# Patient Record
Sex: Female | Born: 1975 | Race: Black or African American | Hispanic: No | Marital: Single | Smoking: Never smoker
Health system: Southern US, Community
[De-identification: ages and names within clinical notes are randomized; demographics above are authoritative.]

## PROBLEM LIST (undated history)

## (undated) DIAGNOSIS — I10 Essential (primary) hypertension: Secondary | ICD-10-CM

---

## 2009-08-10 ENCOUNTER — Ambulatory Visit: Payer: Self-pay | Admitting: Obstetrics & Gynecology

## 2009-08-22 ENCOUNTER — Ambulatory Visit (HOSPITAL_COMMUNITY): Admission: RE | Admit: 2009-08-22 | Discharge: 2009-08-22 | Payer: Self-pay | Admitting: Obstetrics & Gynecology

## 2009-09-14 ENCOUNTER — Ambulatory Visit: Payer: Self-pay | Admitting: Family Medicine

## 2009-09-18 ENCOUNTER — Encounter: Payer: Self-pay | Admitting: Obstetrics & Gynecology

## 2009-09-18 ENCOUNTER — Ambulatory Visit: Payer: Self-pay | Admitting: Obstetrics & Gynecology

## 2009-09-18 LAB — CONVERTED CEMR LAB
AST: 39 units/L — ABNORMAL HIGH (ref 0–37)
Albumin: 3.8 g/dL (ref 3.5–5.2)
Antibody Screen: NEGATIVE
CO2: 23 meq/L (ref 19–32)
Chloride: 103 meq/L (ref 96–112)
Creatinine Clearance: 206 mL/min — ABNORMAL HIGH (ref 75–115)
Glucose, Bld: 128 mg/dL — ABNORMAL HIGH (ref 70–99)
HCT: 32.3 % — ABNORMAL LOW (ref 36.0–46.0)
Potassium: 4 meq/L (ref 3.5–5.3)
Protein, Ur: 683 mg/24hr — ABNORMAL HIGH (ref 50–100)
RDW: 13 % (ref 11.5–15.5)
Rh Type: POSITIVE
WBC: 5.6 10*3/uL (ref 4.0–10.5)

## 2009-09-19 ENCOUNTER — Ambulatory Visit (HOSPITAL_COMMUNITY): Admission: RE | Admit: 2009-09-19 | Discharge: 2009-09-19 | Payer: Self-pay | Admitting: Obstetrics & Gynecology

## 2009-09-28 ENCOUNTER — Ambulatory Visit: Payer: Self-pay | Admitting: Obstetrics & Gynecology

## 2009-10-17 ENCOUNTER — Ambulatory Visit (HOSPITAL_COMMUNITY): Admission: RE | Admit: 2009-10-17 | Discharge: 2009-10-17 | Payer: Self-pay | Admitting: Obstetrics & Gynecology

## 2009-11-02 ENCOUNTER — Ambulatory Visit: Payer: Self-pay | Admitting: Obstetrics & Gynecology

## 2009-11-16 ENCOUNTER — Ambulatory Visit: Payer: Self-pay | Admitting: Obstetrics & Gynecology

## 2009-11-28 ENCOUNTER — Ambulatory Visit (HOSPITAL_COMMUNITY): Admission: RE | Admit: 2009-11-28 | Discharge: 2009-11-28 | Payer: Self-pay | Admitting: Obstetrics & Gynecology

## 2009-11-30 ENCOUNTER — Ambulatory Visit: Payer: Self-pay | Admitting: Family Medicine

## 2009-11-30 LAB — CONVERTED CEMR LAB
HCT: 30.7 % — ABNORMAL LOW (ref 36.0–46.0)
Hemoglobin: 10.3 g/dL — ABNORMAL LOW (ref 12.0–15.0)
MCV: 84.6 fL (ref 78.0–100.0)
WBC: 6.8 10*3/uL (ref 4.0–10.5)

## 2009-12-14 ENCOUNTER — Ambulatory Visit: Payer: Self-pay | Admitting: Obstetrics & Gynecology

## 2009-12-21 ENCOUNTER — Ambulatory Visit: Payer: Self-pay | Admitting: Obstetrics & Gynecology

## 2009-12-21 LAB — CONVERTED CEMR LAB
HCT: 31.3 % — ABNORMAL LOW (ref 36.0–46.0)
MCV: 84.1 fL (ref 78.0–100.0)
RDW: 13.5 % (ref 11.5–15.5)

## 2009-12-26 ENCOUNTER — Ambulatory Visit (HOSPITAL_COMMUNITY): Admission: RE | Admit: 2009-12-26 | Discharge: 2009-12-26 | Payer: Self-pay | Admitting: Obstetrics & Gynecology

## 2010-01-01 ENCOUNTER — Ambulatory Visit: Payer: Self-pay | Admitting: Obstetrics & Gynecology

## 2010-01-04 ENCOUNTER — Ambulatory Visit: Payer: Self-pay | Admitting: Obstetrics and Gynecology

## 2010-01-11 ENCOUNTER — Ambulatory Visit: Payer: Self-pay | Admitting: Family Medicine

## 2010-01-16 ENCOUNTER — Ambulatory Visit (HOSPITAL_COMMUNITY): Admission: RE | Admit: 2010-01-16 | Discharge: 2010-01-16 | Payer: Self-pay | Admitting: Obstetrics & Gynecology

## 2010-01-19 ENCOUNTER — Ambulatory Visit: Payer: Self-pay | Admitting: Obstetrics & Gynecology

## 2010-01-23 ENCOUNTER — Ambulatory Visit: Payer: Self-pay | Admitting: Obstetrics & Gynecology

## 2010-01-25 ENCOUNTER — Ambulatory Visit: Payer: Self-pay | Admitting: Obstetrics & Gynecology

## 2010-01-30 ENCOUNTER — Inpatient Hospital Stay (HOSPITAL_COMMUNITY): Admission: AD | Admit: 2010-01-30 | Discharge: 2010-01-30 | Payer: Self-pay | Admitting: Obstetrics & Gynecology

## 2010-01-30 ENCOUNTER — Encounter: Payer: Self-pay | Admitting: *Deleted

## 2010-01-30 ENCOUNTER — Ambulatory Visit: Payer: Self-pay | Admitting: Advanced Practice Midwife

## 2010-02-01 ENCOUNTER — Ambulatory Visit: Payer: Self-pay | Admitting: Obstetrics & Gynecology

## 2010-02-06 ENCOUNTER — Ambulatory Visit (HOSPITAL_COMMUNITY): Admission: RE | Admit: 2010-02-06 | Discharge: 2010-02-06 | Payer: Self-pay | Admitting: Obstetrics & Gynecology

## 2010-02-06 ENCOUNTER — Ambulatory Visit: Payer: Self-pay | Admitting: Obstetrics & Gynecology

## 2010-02-12 ENCOUNTER — Ambulatory Visit: Payer: Self-pay | Admitting: Obstetrics & Gynecology

## 2010-02-13 ENCOUNTER — Ambulatory Visit (HOSPITAL_COMMUNITY): Admission: RE | Admit: 2010-02-13 | Discharge: 2010-02-13 | Payer: Self-pay | Admitting: Obstetrics & Gynecology

## 2010-02-15 ENCOUNTER — Ambulatory Visit: Payer: Self-pay | Admitting: Family Medicine

## 2010-02-19 ENCOUNTER — Ambulatory Visit: Payer: Self-pay | Admitting: Obstetrics & Gynecology

## 2010-02-19 ENCOUNTER — Ambulatory Visit (HOSPITAL_COMMUNITY): Admission: RE | Admit: 2010-02-19 | Discharge: 2010-02-19 | Payer: Self-pay | Admitting: Family Medicine

## 2010-02-22 ENCOUNTER — Ambulatory Visit: Payer: Self-pay | Admitting: Obstetrics & Gynecology

## 2010-02-26 ENCOUNTER — Encounter: Payer: Self-pay | Admitting: Obstetrics & Gynecology

## 2010-02-26 ENCOUNTER — Encounter (INDEPENDENT_AMBULATORY_CARE_PROVIDER_SITE_OTHER): Payer: Self-pay | Admitting: *Deleted

## 2010-02-26 ENCOUNTER — Ambulatory Visit: Payer: Self-pay | Admitting: Obstetrics & Gynecology

## 2010-02-26 LAB — CONVERTED CEMR LAB
ALT: 19 units/L (ref 0–35)
Albumin: 3.6 g/dL (ref 3.5–5.2)
BUN: 7 mg/dL (ref 6–23)
Calcium: 9.7 mg/dL (ref 8.4–10.5)
Creatinine 24 HR UR: 1520 mg/24hr (ref 700–1800)
Creatinine, Urine: 89.4 mg/dL
Glucose, Bld: 79 mg/dL (ref 70–99)
Hemoglobin: 11.9 g/dL — ABNORMAL LOW (ref 12.0–15.0)
MCV: 83.1 fL (ref 78.0–100.0)
Platelets: 164 10*3/uL (ref 150–400)
Protein, Ur: 714 mg/24hr — ABNORMAL HIGH (ref 50–100)
RBC: 4.21 M/uL (ref 3.87–5.11)
Sodium: 135 meq/L (ref 135–145)
Total Bilirubin: 0.5 mg/dL (ref 0.3–1.2)

## 2010-03-01 ENCOUNTER — Encounter: Payer: Self-pay | Admitting: Obstetrics & Gynecology

## 2010-03-01 ENCOUNTER — Ambulatory Visit: Payer: Self-pay | Admitting: Obstetrics & Gynecology

## 2010-03-01 LAB — CONVERTED CEMR LAB: Chlamydia, Swab/Urine, PCR: NEGATIVE

## 2010-03-03 ENCOUNTER — Encounter: Payer: Self-pay | Admitting: Obstetrics & Gynecology

## 2010-03-03 ENCOUNTER — Inpatient Hospital Stay (HOSPITAL_COMMUNITY): Admission: RE | Admit: 2010-03-03 | Discharge: 2010-03-08 | Payer: Self-pay | Admitting: Family Medicine

## 2010-03-03 ENCOUNTER — Ambulatory Visit: Payer: Self-pay | Admitting: Obstetrics & Gynecology

## 2010-09-23 ENCOUNTER — Encounter: Payer: Self-pay | Admitting: Obstetrics & Gynecology

## 2010-11-18 LAB — COMPREHENSIVE METABOLIC PANEL
ALT: 28 U/L (ref 0–35)
AST: 31 U/L (ref 0–37)
AST: 36 U/L (ref 0–37)
Albumin: 2.7 g/dL — ABNORMAL LOW (ref 3.5–5.2)
Albumin: 2.9 g/dL — ABNORMAL LOW (ref 3.5–5.2)
Alkaline Phosphatase: 78 U/L (ref 39–117)
CO2: 25 mEq/L (ref 19–32)
Calcium: 9.6 mg/dL (ref 8.4–10.5)
Chloride: 101 mEq/L (ref 96–112)
Creatinine, Ser: 0.68 mg/dL (ref 0.4–1.2)
GFR calc Af Amer: >60 mL/min (ref >60)
GFR calc Af Amer: >60 mL/min (ref >60)
Potassium: 3.7 mEq/L (ref 3.5–5.1)
Sodium: 135 mEq/L (ref 135–145)
Total Bilirubin: 0.5 mg/dL (ref 0.3–1.2)
Total Protein: 5.9 g/dL — ABNORMAL LOW (ref 6.0–8.3)
Total Protein: 6.5 g/dL (ref 6.0–8.3)

## 2010-11-18 LAB — DIFFERENTIAL
Basophils Absolute: 0 10*3/uL (ref 0.0–0.1)
Eosinophils Relative: 0 % (ref 0–5)
Lymphocytes Relative: 8 % — ABNORMAL LOW (ref 12–46)
Monocytes Absolute: 0.4 10*3/uL (ref 0.1–1.0)
Monocytes Relative: 5 % (ref 3–12)
Neutro Abs: 6.7 10*3/uL (ref 1.7–7.7)

## 2010-11-18 LAB — POCT URINALYSIS DIP (DEVICE)
Glucose, UA: NEGATIVE mg/dL
Hgb urine dipstick: NEGATIVE
Hgb urine dipstick: NEGATIVE
Ketones, ur: 15 mg/dL — AB
Ketones, ur: 15 mg/dL — AB
Nitrite: NEGATIVE
Nitrite: NEGATIVE
Protein, ur: >=300 mg/dL — AB
Protein, ur: >=300 mg/dL — AB
Protein, ur: >=300 mg/dL — AB
Protein, ur: >=300 mg/dL — AB
Specific Gravity, Urine: 1.025 (ref 1.005–1.030)
Specific Gravity, Urine: >=1.03 (ref 1.005–1.030)
Urobilinogen, UA: 1 mg/dL (ref 0.0–1.0)
Urobilinogen, UA: 1 mg/dL (ref 0.0–1.0)
Urobilinogen, UA: 1 mg/dL (ref 0.0–1.0)
Urobilinogen, UA: >=8 mg/dL (ref 0.0–1.0)
pH: 6.5 (ref 5.0–8.0)
pH: 6 (ref 5.0–8.0)
pH: 6.5 (ref 5.0–8.0)
pH: 6.5 (ref 5.0–8.0)

## 2010-11-18 LAB — CBC
Hemoglobin: 11.9 g/dL — ABNORMAL LOW (ref 12.0–15.0)
MCHC: 34.3 g/dL (ref 30.0–36.0)
MCHC: 35.1 g/dL (ref 30.0–36.0)
MCV: 86 fL (ref 78.0–100.0)
Platelets: 114 10*3/uL — ABNORMAL LOW (ref 150–400)
Platelets: 139 10*3/uL — ABNORMAL LOW (ref 150–400)
Platelets: 150 10*3/uL (ref 150–400)
Platelets: 179 10*3/uL (ref 150–400)
RBC: 4.01 MIL/uL (ref 3.87–5.11)
RBC: 4.51 MIL/uL (ref 3.87–5.11)
RDW: 14 % (ref 11.5–15.5)
RDW: 14 % (ref 11.5–15.5)
RDW: 14.3 % (ref 11.5–15.5)
WBC: 6.1 10*3/uL (ref 4.0–10.5)
WBC: 7.2 10*3/uL (ref 4.0–10.5)
WBC: 7.7 10*3/uL (ref 4.0–10.5)

## 2010-11-18 LAB — RPR: RPR Ser Ql: NONREACTIVE

## 2010-11-18 LAB — URINALYSIS, MICROSCOPIC ONLY
Bilirubin Urine: NEGATIVE
Glucose, UA: NEGATIVE mg/dL
Ketones, ur: 40 mg/dL — AB
pH: 6 (ref 5.0–8.0)

## 2010-11-19 LAB — POCT URINALYSIS DIP (DEVICE)
Glucose, UA: NEGATIVE mg/dL
Glucose, UA: 500 mg/dL — AB
Hgb urine dipstick: NEGATIVE
Nitrite: NEGATIVE
Nitrite: NEGATIVE
Nitrite: NEGATIVE
Protein, ur: 30 mg/dL — AB
Protein, ur: >=300 mg/dL — AB
Specific Gravity, Urine: 1.02 (ref 1.005–1.030)
Urobilinogen, UA: 1 mg/dL (ref 0.0–1.0)
Urobilinogen, UA: 1 mg/dL (ref 0.0–1.0)
pH: 6 (ref 5.0–8.0)
pH: 6 (ref 5.0–8.0)

## 2010-11-19 LAB — GLUCOSE, CAPILLARY: Glucose-Capillary: 72 mg/dL (ref 70–99)

## 2010-11-20 LAB — POCT URINALYSIS DIP (DEVICE)
Hgb urine dipstick: NEGATIVE
Nitrite: NEGATIVE
Nitrite: POSITIVE — AB
Nitrite: NEGATIVE
Protein, ur: >=300 mg/dL — AB
Protein, ur: 100 mg/dL — AB
Urobilinogen, UA: 0.2 mg/dL (ref 0.0–1.0)
Urobilinogen, UA: 1 mg/dL (ref 0.0–1.0)
Urobilinogen, UA: 1 mg/dL (ref 0.0–1.0)
pH: 6 (ref 5.0–8.0)
pH: 6 (ref 5.0–8.0)

## 2010-11-21 LAB — POCT URINALYSIS DIP (DEVICE)
Glucose, UA: 250 mg/dL — AB
Hgb urine dipstick: NEGATIVE
Nitrite: NEGATIVE
Urobilinogen, UA: 0.2 mg/dL (ref 0.0–1.0)

## 2010-11-26 LAB — POCT URINALYSIS DIP (DEVICE)
Bilirubin Urine: NEGATIVE
Glucose, UA: NEGATIVE mg/dL
Nitrite: NEGATIVE
Nitrite: NEGATIVE
Protein, ur: 100 mg/dL — AB
Specific Gravity, Urine: >=1.03 (ref 1.005–1.030)
Urobilinogen, UA: 0.2 mg/dL (ref 0.0–1.0)
Urobilinogen, UA: 0.2 mg/dL (ref 0.0–1.0)
pH: 5.5 (ref 5.0–8.0)

## 2010-12-04 LAB — POCT URINALYSIS DIP (DEVICE)
Bilirubin Urine: NEGATIVE
Glucose, UA: NEGATIVE mg/dL
Nitrite: NEGATIVE

## 2011-04-23 IMAGING — US US FETAL BPP W/O NONSTRESS
1 series · 10 of 10 positions shown · non-contrast
Comparison: none

OBSTETRICAL ULTRASOUND:
 This ultrasound was performed in The [HOSPITAL], and the [facility] report will be stored to [REDACTED] PACS.  This report is also available in [HOSPITAL]?s accessANYware.

[Series 1: us fetal bpp w/o nonstress · 10 acquisitions, 10 frames shown]
[im 1/10]
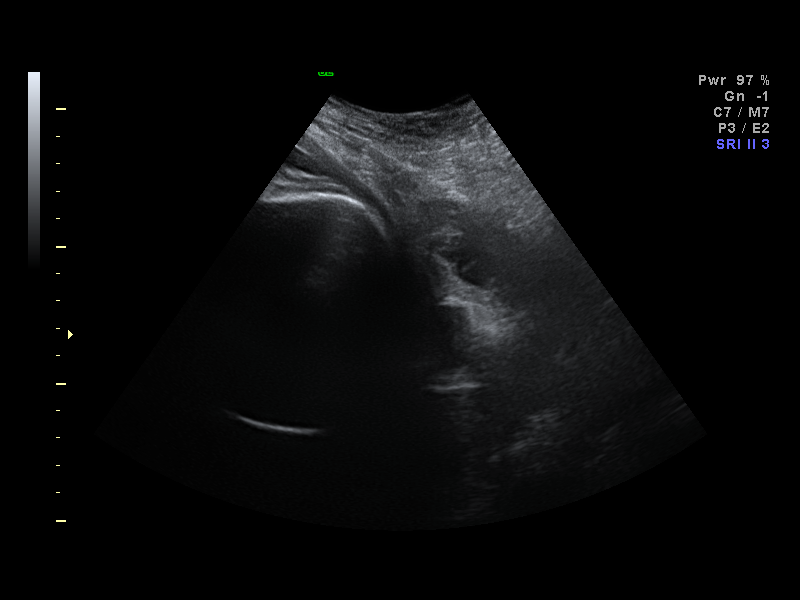
[im 2/10]
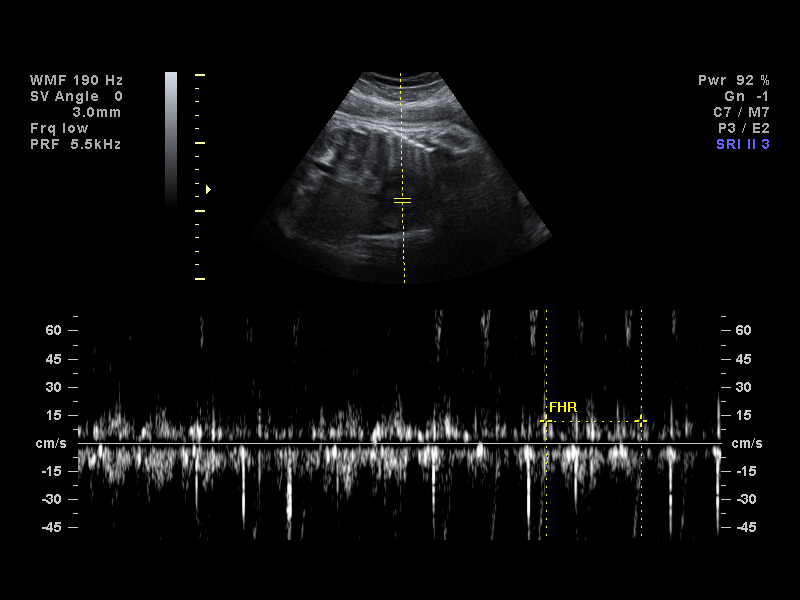
[im 3/10]
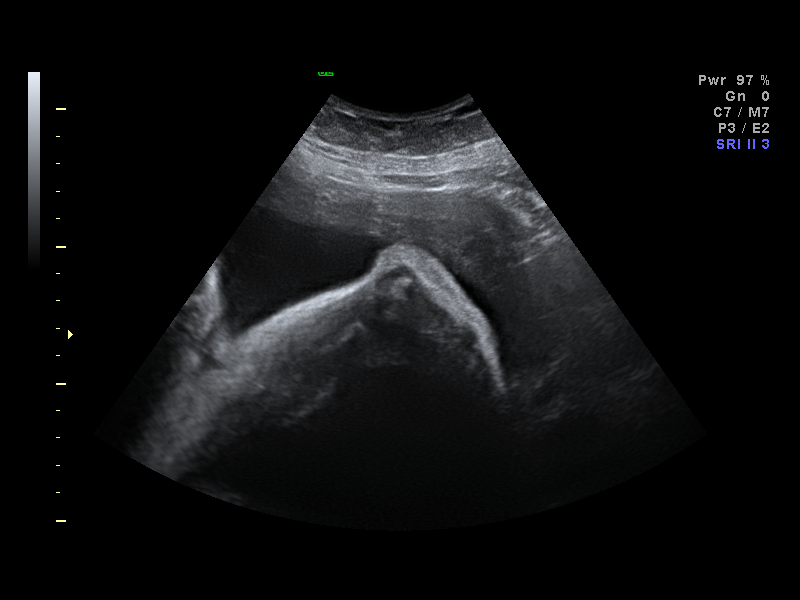
[im 4/10]
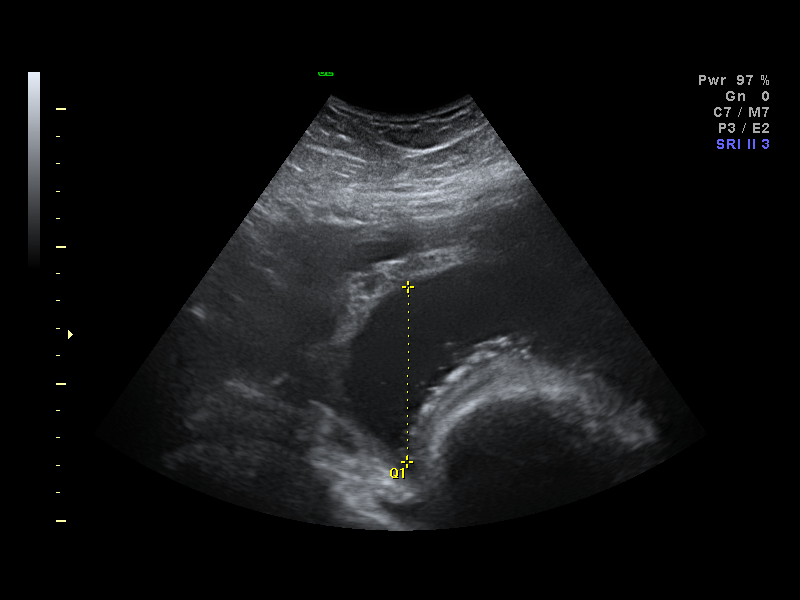
[im 5/10]
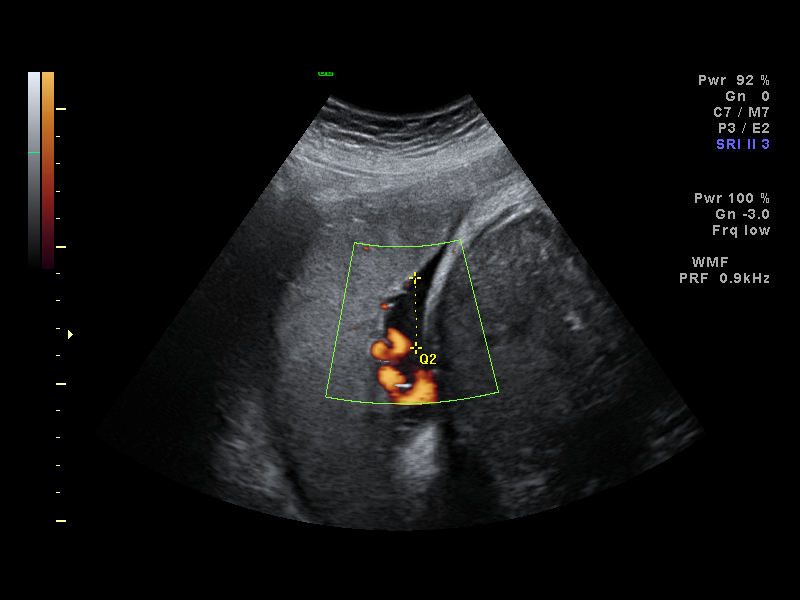
[im 6/10]
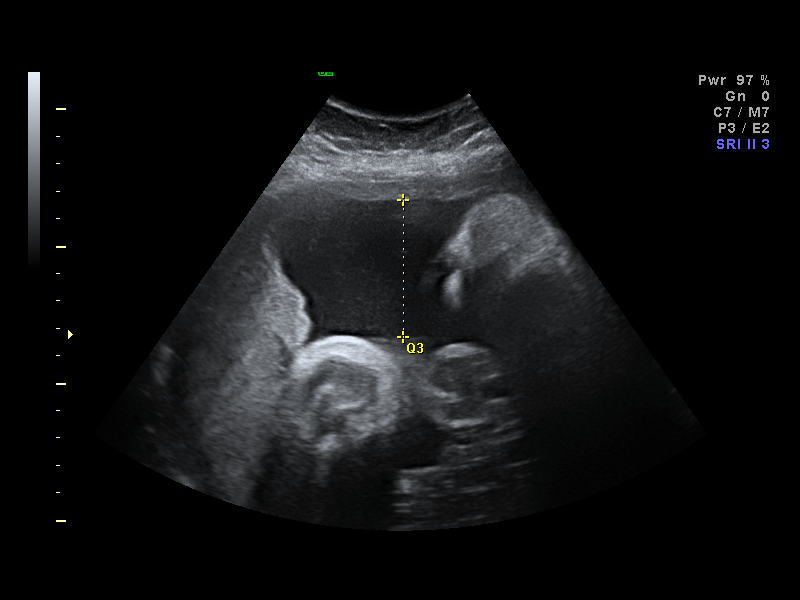
[im 7/10]
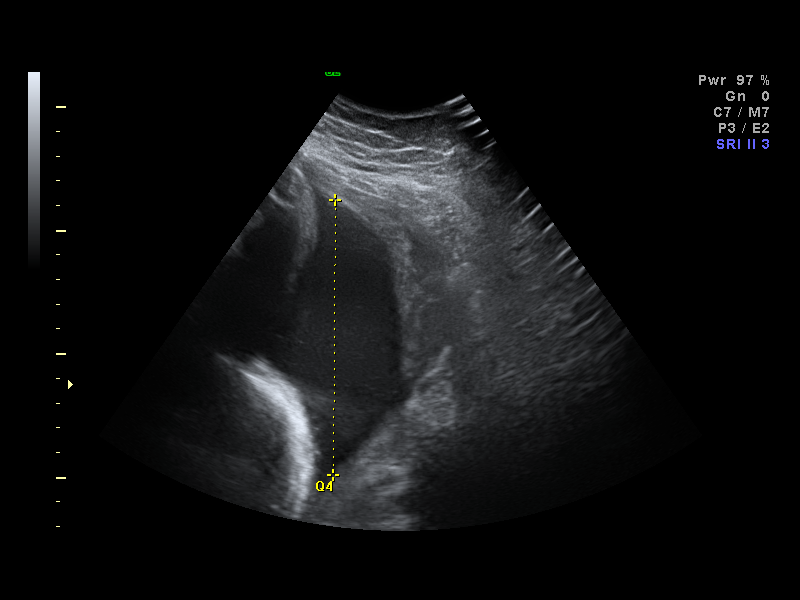
[im 8/10]
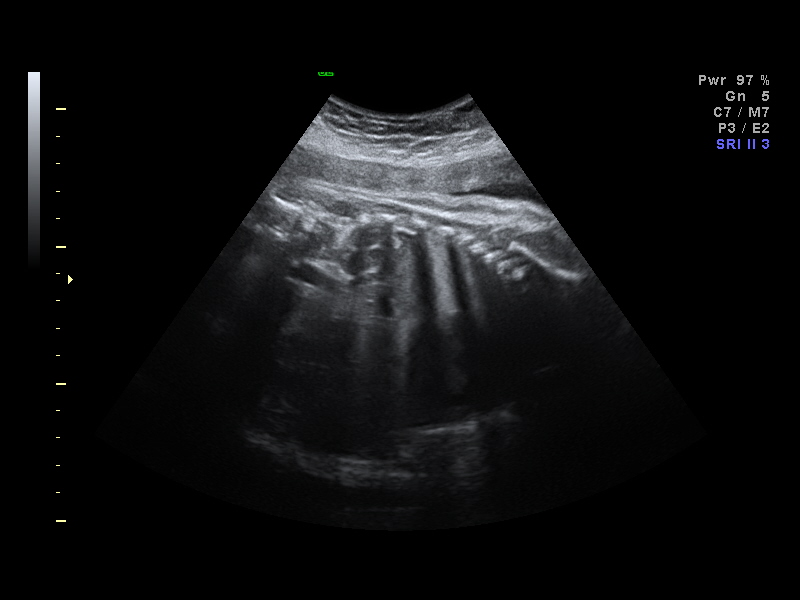
[im 9/10]
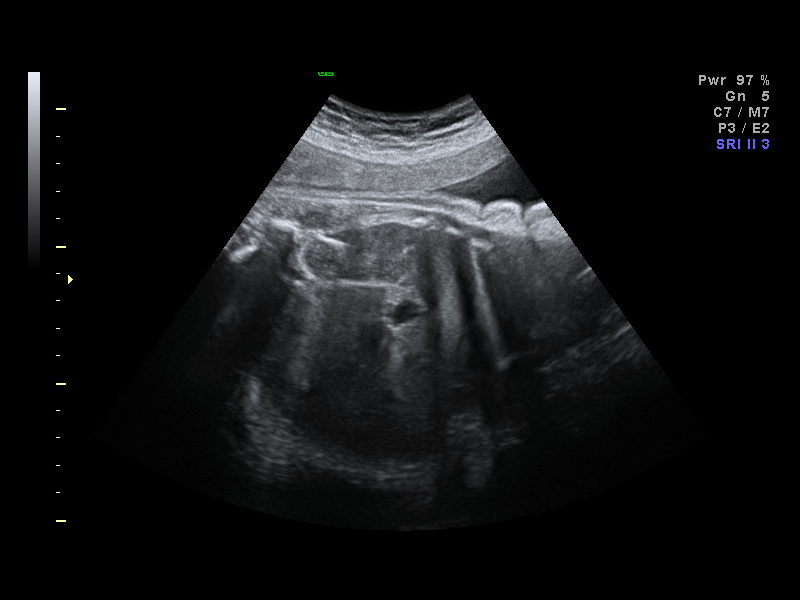
[im 10/10]
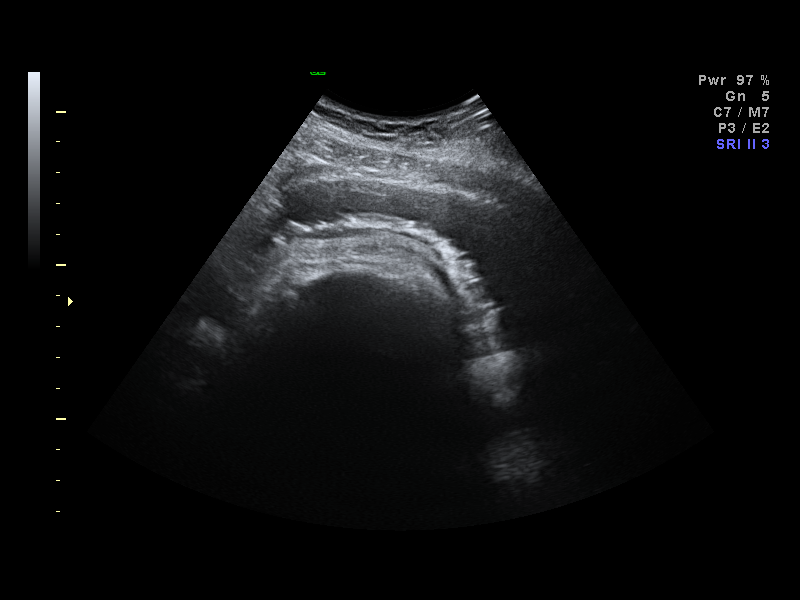

[10 of 10 positions shown; findings below may reference images not displayed]

IMPRESSION: [facility] has also been faxed to the ordering physician.

## 2011-05-20 IMAGING — CR DG ABDOMEN 1V
3 series · 3 of 3 positions shown · non-contrast
Comparison: Prior ultrasound of pregnancy performed 02/19/2010

CLINICAL DATA: Status post C-section on 03/03/2010, with mid
abdominal pain.

ABDOMEN - 1 VIEW

[view not recorded (1 of 3)]
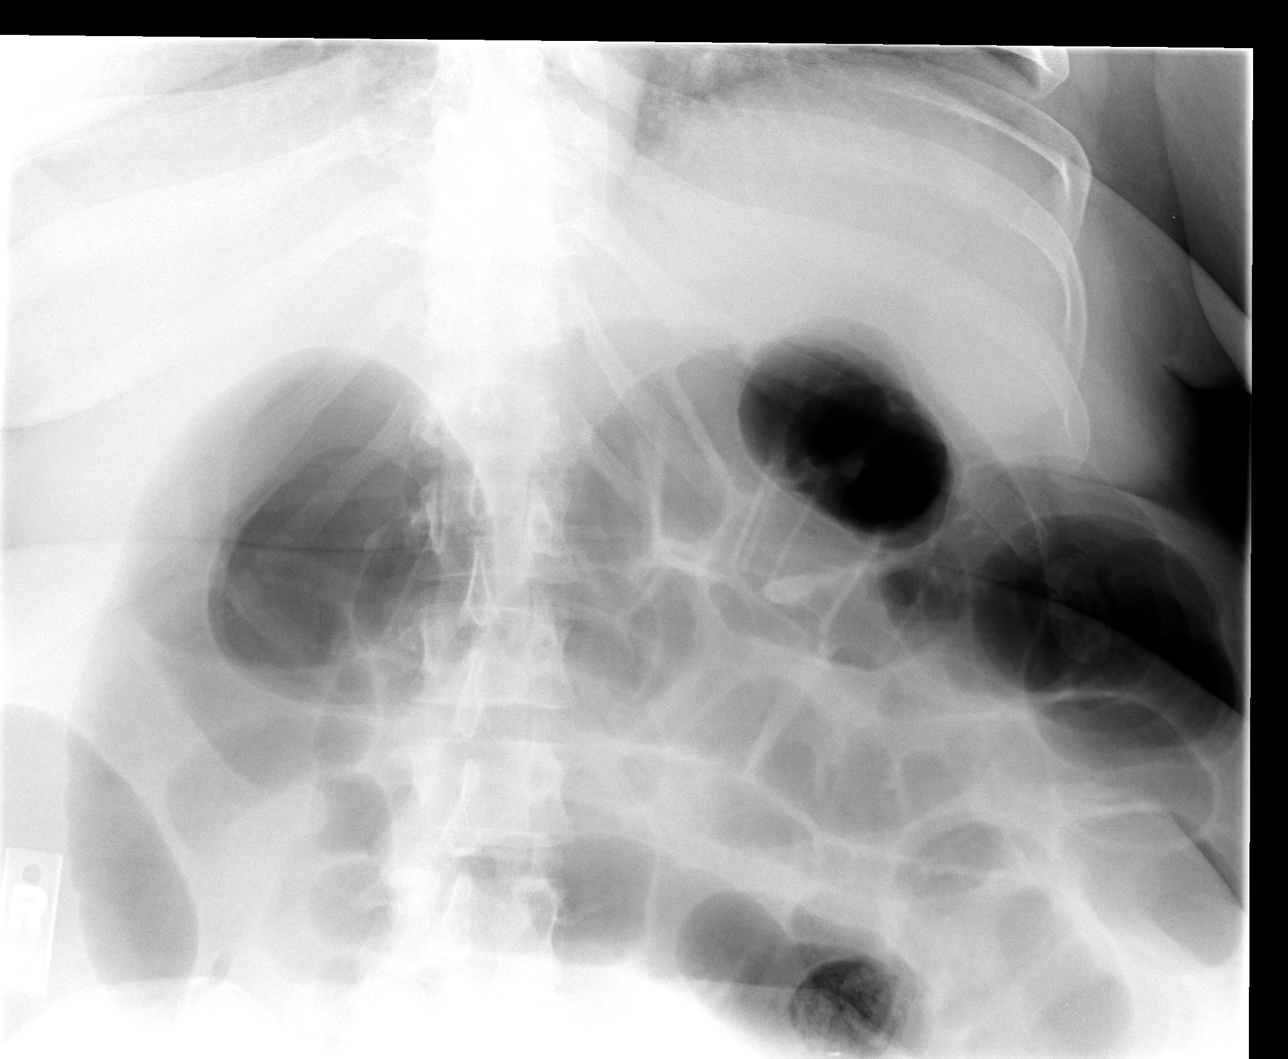

[view not recorded (2 of 3)]
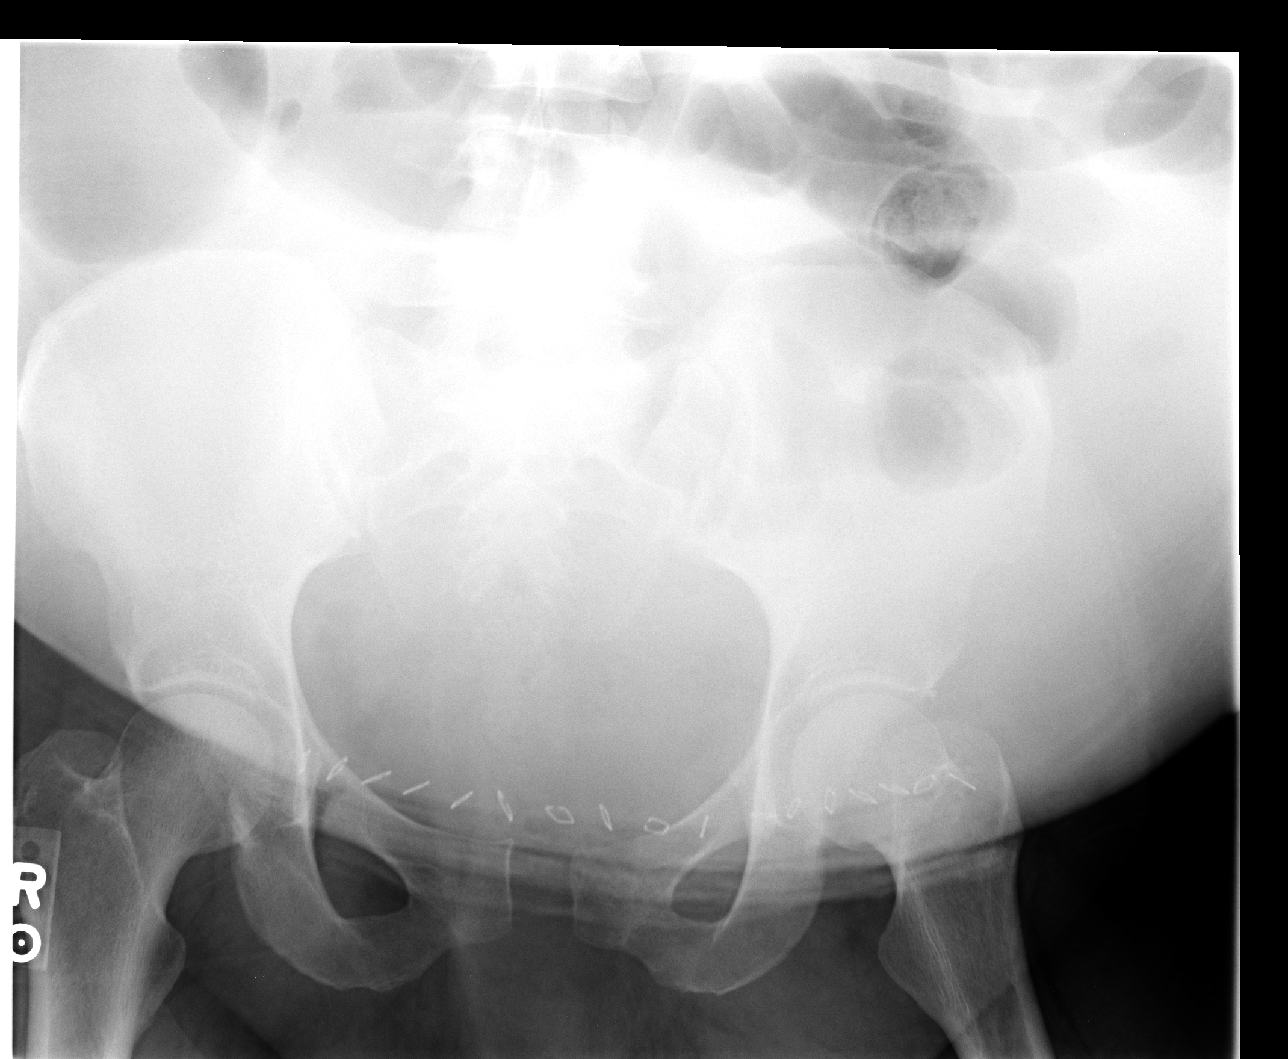

[view not recorded (3 of 3)]
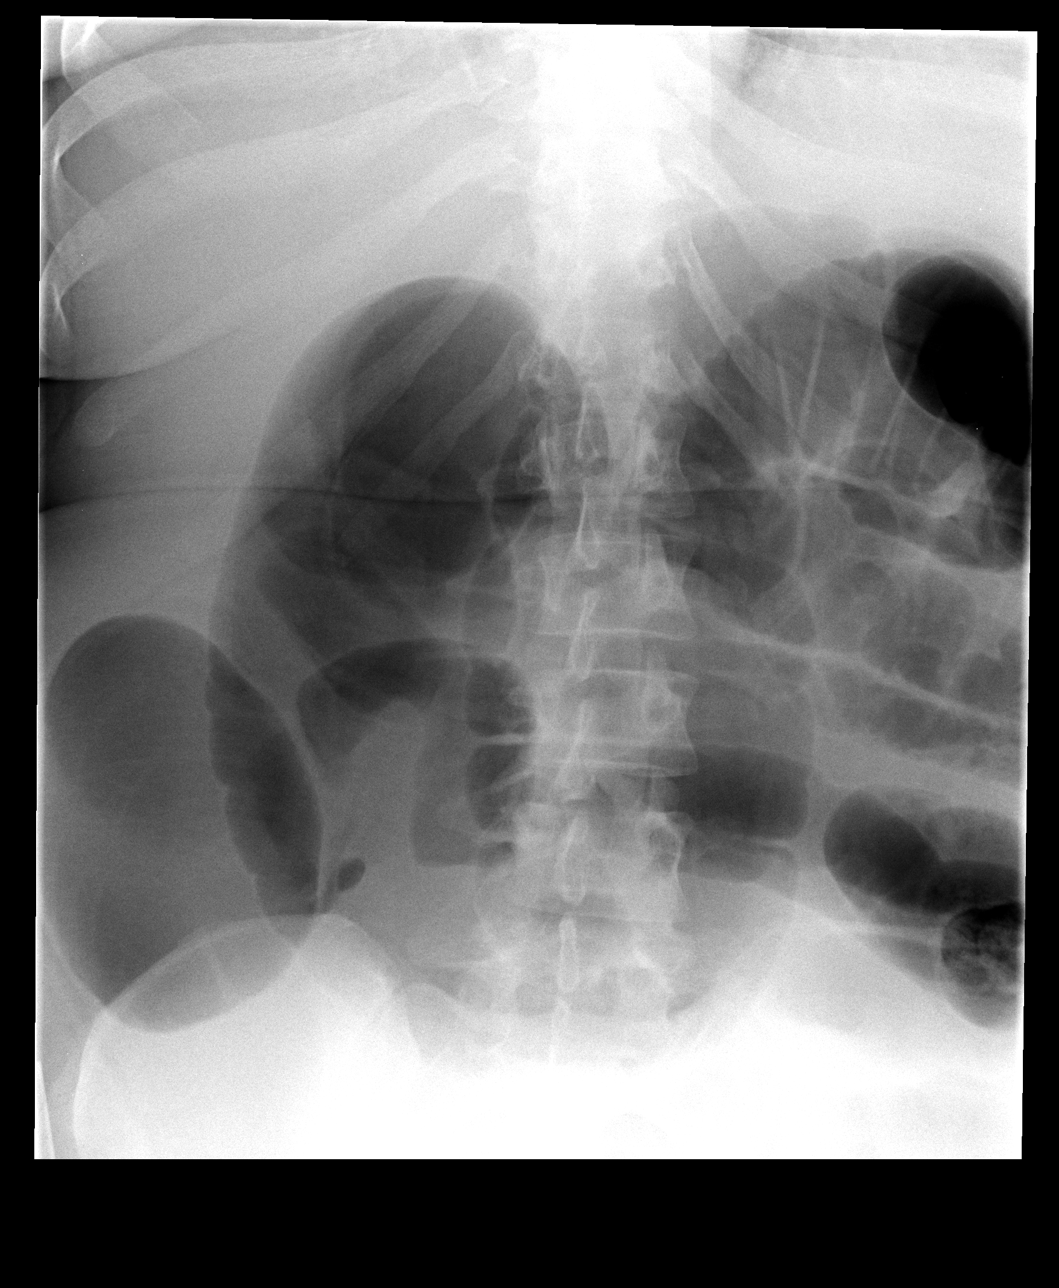

[3 of 3 positions shown; findings below may reference images not displayed]

FINDINGS: There is diffuse gaseous distension of small and large
bowel loops; this most likely reflects ileus.  However, no bowel
air is identified within the pelvis, and distal obstruction cannot
be entirely excluded. No free intra-abdominal air is identified,
although evaluation is suboptimal on provided views.

No acute osseous abnormalities are seen.  Skin staples are noted
overlying the lower pelvis.
IMPRESSION: Diffuse gaseous distension of small and large bowel loops most
likely reflects ileus.  However, no bowel air is noted within the
pelvis; distal obstruction cannot be entirely excluded.  No
definite free intra-abdominal air seen.

## 2012-12-21 ENCOUNTER — Encounter (HOSPITAL_COMMUNITY): Payer: Self-pay | Admitting: Emergency Medicine

## 2012-12-21 ENCOUNTER — Emergency Department (HOSPITAL_COMMUNITY)
Admission: EM | Admit: 2012-12-21 | Discharge: 2012-12-21 | Disposition: A | Discharge status: Home / Self Care | Payer: Medicaid Other | Attending: Emergency Medicine | Admitting: Emergency Medicine

## 2012-12-21 DIAGNOSIS — I10 Essential (primary) hypertension: Secondary | ICD-10-CM | POA: Insufficient documentation

## 2012-12-21 DIAGNOSIS — J019 Acute sinusitis, unspecified: Secondary | ICD-10-CM | POA: Insufficient documentation

## 2012-12-21 DIAGNOSIS — R5381 Other malaise: Secondary | ICD-10-CM | POA: Insufficient documentation

## 2012-12-21 DIAGNOSIS — J302 Other seasonal allergic rhinitis: Secondary | ICD-10-CM

## 2012-12-21 DIAGNOSIS — J309 Allergic rhinitis, unspecified: Secondary | ICD-10-CM | POA: Insufficient documentation

## 2012-12-21 DIAGNOSIS — R5383 Other fatigue: Secondary | ICD-10-CM | POA: Insufficient documentation

## 2012-12-21 DIAGNOSIS — J329 Chronic sinusitis, unspecified: Secondary | ICD-10-CM

## 2012-12-21 HISTORY — DX: Essential (primary) hypertension: I10

## 2012-12-21 MED ORDER — FEXOFENADINE-PSEUDOEPHED ER 60-120 MG PO TB12: 1.0000 | ORAL_TABLET | Freq: Two times a day (BID) | ORAL | Status: AC

## 2012-12-21 MED ORDER — AMOXICILLIN 500 MG PO CAPS: 500.0000 mg | ORAL_CAPSULE | Freq: Three times a day (TID) | ORAL | Status: DC

## 2012-12-21 MED ORDER — FLUTICASONE PROPIONATE 50 MCG/ACT NA SUSP: 2.0000 | Freq: Every day | NASAL | Status: AC

## 2012-12-21 NOTE — ED Provider Notes (Signed)
Medical screening examination/treatment/procedure(s) were performed by non-physician practitioner and as supervising physician I was immediately available for consultation/collaboration.  Flint Melter, MD 12/21/12 [phone]

## 2012-12-21 NOTE — ED Notes (Signed)
Pt had "head cold" 3 weeks ago. Since then she has had left sinus pain. States it has kept worsening to the point the pain went to left eye and teeth. Has regular appointment with PCP on Thursday but can't tolerate the pain until then. Pt is out of BP meds. BP elevated today.

## 2012-12-21 NOTE — ED Provider Notes (Signed)
History     CSN: [id number]  Arrival date & time 12/21/12  0820   First MD Initiated Contact with Patient 12/21/12 [phone]      No chief complaint on file.   (Consider location/radiation/quality/duration/timing/severity/associated sxs/prior treatment) HPI Ann Haynes is a 37 y.o. female who presents with complaint of sinus pressure for several days. Complaining of nasal congestion, "clogged ears." No fever, no sore throat, no cough. Hx of sinus infections. Has not tried any medications or treatments. Nothing making symptoms better or worse. States has apt with her PCP in 3 days but sinus pressure is worsening, states unable to tolerate.   Past Medical History  Diagnosis Date  . Hypertension     History reviewed. No pertinent past surgical history.  No family history on file.  History  Substance Use Topics  . Smoking status: Never Smoker   . Smokeless tobacco: Not on file  . Alcohol Use: Yes     Comment: occasionally    OB History   Grav Para Term Preterm Abortions TAB SAB Ect Mult Living                  Review of Systems  Constitutional: Positive for fatigue. Negative for fever and chills.  HENT: Positive for ear pain, congestion, sneezing and sinus pressure. Negative for sore throat, neck pain and neck stiffness.   Eyes: Positive for itching. Negative for pain and discharge.  Respiratory: Negative for cough and wheezing.   Cardiovascular: Negative.   Genitourinary: Negative for dysuria.  Skin: Negative for rash.  Neurological: Positive for headaches. Negative for dizziness, weakness and light-headedness.    Allergies  Review of patient's allergies indicates no known allergies.  Home Medications   Current Outpatient Rx  Name  Route  Sig  Dispense  Refill  . ibuprofen (ADVIL,MOTRIN) 200 MG tablet   Oral   Take 600 mg by mouth every 6 (six) hours as needed for pain.           BP 164/110  Pulse 78  Temp(Src) 97 F (36.1 C) (Oral)  Resp 20  SpO2  99%  LMP 12/14/2012  Physical Exam  Nursing note and vitals reviewed. Constitutional: She is oriented to person, place, and time. She appears well-developed and well-nourished. No distress.  HENT:  Head: Normocephalic and atraumatic.  Right Ear: Tympanic membrane, external ear and ear canal normal.  Left Ear: Tympanic membrane, external ear and ear canal normal.  Nose: Mucosal edema present. Right sinus exhibits maxillary sinus tenderness and frontal sinus tenderness. Left sinus exhibits maxillary sinus tenderness and frontal sinus tenderness.  Mouth/Throat: Oropharynx is clear and moist. No oropharyngeal exudate.  Eyes: Conjunctivae are normal.  Neck: Normal range of motion. Neck supple.  Cardiovascular: Normal rate, regular rhythm and normal heart sounds.   Pulmonary/Chest: Effort normal and breath sounds normal. No respiratory distress. She has no wheezes. She has no rales.  Musculoskeletal: She exhibits no edema.  Neurological: She is alert and oriented to person, place, and time.  Skin: Skin is warm and dry.    ED Course  Procedures (including critical care time)  1. Sinusitis   2. Seasonal allergies       MDM   PT with sinus pressure, tenderness on exam. Sinus headache. Allergy type symptoms. Has not tried any medications for this. Afebrile. Will treat for seasonal allergies. Pt concerned about possible infectious sinusitis. Flonase.  Will start on amoxil. Pt's bp elevated here, did not take her BP medications today. She  has close follow up with pcp in 3 days, instructed to see them for recheck.   Filed Vitals:   12/21/12 0825  BP: 164/110  Pulse: 78  Temp: 97 F (36.1 C)  TempSrc: Oral  Resp: 20  SpO2: 99%            Remy Voiles A Jnae Thomaston, PA-C 12/21/12 1004

## 2014-09-17 ENCOUNTER — Encounter (HOSPITAL_COMMUNITY): Payer: Self-pay | Admitting: Cardiology

## 2014-09-17 ENCOUNTER — Emergency Department (HOSPITAL_COMMUNITY)
Admission: EM | Admit: 2014-09-17 | Discharge: 2014-09-17 | Disposition: A | Discharge status: Home / Self Care | Payer: Medicaid Other | Attending: Emergency Medicine | Admitting: Emergency Medicine

## 2014-09-17 DIAGNOSIS — J0100 Acute maxillary sinusitis, unspecified: Secondary | ICD-10-CM | POA: Diagnosis not present

## 2014-09-17 DIAGNOSIS — I10 Essential (primary) hypertension: Secondary | ICD-10-CM | POA: Diagnosis not present

## 2014-09-17 DIAGNOSIS — R51 Headache: Secondary | ICD-10-CM

## 2014-09-17 DIAGNOSIS — Z79899 Other long term (current) drug therapy: Secondary | ICD-10-CM | POA: Diagnosis not present

## 2014-09-17 DIAGNOSIS — R519 Headache, unspecified: Secondary | ICD-10-CM

## 2014-09-17 DIAGNOSIS — Z792 Long term (current) use of antibiotics: Secondary | ICD-10-CM | POA: Insufficient documentation

## 2014-09-17 DIAGNOSIS — Z7951 Long term (current) use of inhaled steroids: Secondary | ICD-10-CM | POA: Diagnosis not present

## 2014-09-17 MED ORDER — HYDROCODONE-ACETAMINOPHEN 5-325 MG PO TABS
2.0000 | ORAL_TABLET | Freq: Once | ORAL | Status: AC
  Administered 2014-09-17: 2 via ORAL
  Filled 2014-09-17: qty 2

## 2014-09-17 MED ORDER — AMOXICILLIN-POT CLAVULANATE 875-125 MG PO TABS: 1.0000 | ORAL_TABLET | Freq: Two times a day (BID) | ORAL | Status: AC

## 2014-09-17 MED ORDER — HYDROCODONE-ACETAMINOPHEN 5-325 MG PO TABS: 1.0000 | ORAL_TABLET | Freq: Four times a day (QID) | ORAL | Status: AC | PRN

## 2014-09-17 NOTE — ED Provider Notes (Signed)
CSN: [id number]     Arrival date & time 09/17/14  1319 History   First MD Initiated Contact with Patient 09/17/14 1514     Chief Complaint  Patient presents with  . Facial Pain     (Consider location/radiation/quality/duration/timing/severity/associated sxs/prior Treatment) Patient is a 39 y.o. female presenting with URI. The history is provided by the patient.  URI Presenting symptoms: congestion (nasal, left sided) and rhinorrhea (mild)   Presenting symptoms: no cough, no ear pain and no fever   Congestion:    Location:  Nasal   Interferes with sleep: yes   Severity:  Mild Onset quality:  Gradual Duration:  1 week Timing:  Constant Progression:  Unchanged Chronicity:  New Relieved by:  Nothing Ineffective treatments: nasal sprays, motrin. Associated symptoms: sinus pain   Associated symptoms: no headaches, no myalgias, no neck pain, no sneezing, no swollen glands and no wheezing     Past Medical History  Diagnosis Date  . Hypertension    History reviewed. No pertinent past surgical history. History reviewed. No pertinent family history. History  Substance Use Topics  . Smoking status: Never Smoker   . Smokeless tobacco: Not on file  . Alcohol Use: Yes     Comment: occasionally   OB History    No data available     Review of Systems  Constitutional: Negative for fever.  HENT: Positive for congestion (nasal, left sided), rhinorrhea (mild) and sinus pressure (left sided). Negative for ear pain, facial swelling and sneezing.   Respiratory: Negative for cough, shortness of breath and wheezing.   Cardiovascular: Negative for chest pain and leg swelling.  Gastrointestinal: Negative for nausea, vomiting and abdominal pain.  Musculoskeletal: Negative for myalgias and neck pain.  Neurological: Negative for headaches.  All other systems reviewed and are negative.     Allergies  Review of patient's allergies indicates no known allergies.  Home Medications   Prior  to Admission medications   Medication Sig Start Date End Date Taking? Authorizing Provider  amLODipine (NORVASC) 5 MG tablet Take 5 mg by mouth daily.    Historical Provider, MD  amoxicillin (AMOXIL) 500 MG capsule Take 1 capsule (500 mg total) by mouth 3 (three) times daily. 12/21/12   Tatyana A Kirichenko, PA-C  amoxicillin-clavulanate (AUGMENTIN) 875-125 MG per tablet Take 1 tablet by mouth 2 (two) times daily. One po bid x 7 days 09/17/14   Elwin MochaBlair Renai Lopata, MD  fexofenadine-pseudoephedrine (ALLEGRA-D) 60-120 MG per tablet Take 1 tablet by mouth every 12 (twelve) hours. 12/21/12   Tatyana A Kirichenko, PA-C  fluticasone (FLONASE) 50 MCG/ACT nasal spray Place 2 sprays into the nose daily. 12/21/12   Tatyana A Kirichenko, PA-C  hydrochlorothiazide (HYDRODIURIL) 25 MG tablet Take 25 mg by mouth daily.    Historical Provider, MD  HYDROcodone-acetaminophen (NORCO/VICODIN) 5-325 MG per tablet Take 1 tablet by mouth every 6 (six) hours as needed for moderate pain. 09/17/14   Elwin MochaBlair Cynthie Garmon, MD  ibuprofen (ADVIL,MOTRIN) 200 MG tablet Take 600 mg by mouth every 6 (six) hours as needed for pain.    Historical Provider, MD   BP 134/69 mmHg  Pulse 66  Temp(Src) 97.2 F (36.2 C) (Oral)  Resp 18  SpO2 100%  LMP 08/25/2014 Physical Exam  Constitutional: She is oriented to person, place, and time. She appears well-developed and well-nourished. No distress.  HENT:  Head: Normocephalic and atraumatic.  Nose: Right sinus exhibits no maxillary sinus tenderness and no frontal sinus tenderness. Left sinus exhibits maxillary sinus tenderness and  frontal sinus tenderness.  Mouth/Throat: Oropharynx is clear and moist. No oropharyngeal exudate.  Eyes: EOM are normal. Pupils are equal, round, and reactive to light.  Neck: Normal range of motion. Neck supple.  Cardiovascular: Normal rate and regular rhythm.  Exam reveals no friction rub.   No murmur heard. Pulmonary/Chest: Effort normal and breath sounds normal. No  respiratory distress. She has no wheezes. She has no rales.  Abdominal: Soft. She exhibits no distension. There is no tenderness. There is no rebound.  Musculoskeletal: Normal range of motion. She exhibits no edema.  Neurological: She is alert and oriented to person, place, and time. No cranial nerve deficit. She exhibits normal muscle tone. Coordination normal.  Skin: No rash noted. She is not diaphoretic.  Nursing note and vitals reviewed.   ED Course  Procedures (including critical care time) Labs Review Labs Reviewed - No data to display  Imaging Review No results found.   EKG Interpretation None      MDM   Final diagnoses:  Acute maxillary sinusitis, recurrence not specified  Facial pain    36F here with facial pain, L sided facial pain, L sided sinus pressure for past week. No fevers. Mild blurry vision. Feels similar to prior sinus infection. Not on any estrogen products, no double vision, normal eye exam, doubt dural sinus thrombosis. AFVSS here. L sinus pain on palpation. Will treat with augmentin. Patient stable for discharge. Given small amount of pain.     Elwin Mocha, MD 09/17/14 [phone]

## 2014-09-17 NOTE — Discharge Instructions (Signed)

## 2014-09-17 NOTE — ED Notes (Signed)
Pt reports facial pain and sinus pressure for the past week. Reports she thinks that she is getting worse over the past couple of days.

## 2015-07-22 ENCOUNTER — Emergency Department (HOSPITAL_COMMUNITY)
Admission: EM | Admit: 2015-07-22 | Discharge: 2015-07-22 | Disposition: A | Discharge status: Home / Self Care | Payer: Medicaid Other | Attending: Emergency Medicine | Admitting: Emergency Medicine

## 2015-07-22 ENCOUNTER — Encounter (HOSPITAL_COMMUNITY): Payer: Self-pay | Admitting: Emergency Medicine

## 2015-07-22 DIAGNOSIS — I10 Essential (primary) hypertension: Secondary | ICD-10-CM | POA: Diagnosis not present

## 2015-07-22 DIAGNOSIS — Z79899 Other long term (current) drug therapy: Secondary | ICD-10-CM | POA: Insufficient documentation

## 2015-07-22 DIAGNOSIS — Z792 Long term (current) use of antibiotics: Secondary | ICD-10-CM | POA: Diagnosis not present

## 2015-07-22 DIAGNOSIS — J0130 Acute sphenoidal sinusitis, unspecified: Secondary | ICD-10-CM | POA: Insufficient documentation

## 2015-07-22 DIAGNOSIS — H9202 Otalgia, left ear: Secondary | ICD-10-CM | POA: Diagnosis not present

## 2015-07-22 DIAGNOSIS — Z7951 Long term (current) use of inhaled steroids: Secondary | ICD-10-CM | POA: Diagnosis not present

## 2015-07-22 DIAGNOSIS — R51 Headache: Secondary | ICD-10-CM | POA: Diagnosis present

## 2015-07-22 MED ORDER — AMOXICILLIN 500 MG PO CAPS: 500.0000 mg | ORAL_CAPSULE | Freq: Three times a day (TID) | ORAL | Status: AC

## 2015-07-22 MED ORDER — AMOXICILLIN 500 MG PO CAPS
500.0000 mg | ORAL_CAPSULE | Freq: Once | ORAL | Status: AC
  Administered 2015-07-22: 500 mg via ORAL
  Filled 2015-07-22: qty 1

## 2015-07-22 NOTE — Discharge Instructions (Signed)
°  Sinusitis, Adult Follow-up with your primary care physician. Sinusitis is redness, soreness, and puffiness (inflammation) of the air pockets in the bones of your face (sinuses). The redness, soreness, and puffiness can cause air and mucus to get trapped in your sinuses. This can allow germs to grow and cause an infection.  HOME CARE   Drink enough fluids to keep your pee (urine) clear or pale yellow.  Use a humidifier in your home.  Run a hot shower to create steam in the bathroom. Sit in the bathroom with the door closed. Breathe in the steam 3-4 times a day.  Put a warm, moist washcloth on your face 3-4 times a day, or as told by your doctor.  Use salt water sprays (saline sprays) to wet the thick fluid in your nose. This can help the sinuses drain.  Only take medicine as told by your doctor. GET HELP RIGHT AWAY IF:   Your pain gets worse.  You have very bad headaches.  You are sick to your stomach (nauseous).  You throw up (vomit).  You are very sleepy (drowsy) all the time.  Your face is puffy (swollen).  Your vision changes.  You have a stiff neck.  You have trouble breathing. MAKE SURE YOU:   Understand these instructions.  Will watch your condition.  Will get help right away if you are not doing well or get worse.   This information is not intended to replace advice given to you by your health care provider. Make sure you discuss any questions you have with your health care provider.   Document Released: 02/05/2008 Document Revised: 09/09/2014 Document Reviewed: 03/24/2012 Elsevier Interactive Patient Education [employer]2016 [employer]

## 2015-07-22 NOTE — ED Notes (Signed)
Pt. Stated, I started having left side face pain since yesterday.  I think its my sinuses.Im also having left ear pain.  Im living in the shelter right now.

## 2015-07-22 NOTE — ED Provider Notes (Signed)
CSN: [id number]     Arrival date & time 07/22/15  1016 History   First MD Initiated Contact with Patient 07/22/15 1021     Chief Complaint  Patient presents with  . Facial Pain    (Consider location/radiation/quality/duration/timing/severity/associated sxs/prior Treatment) The history is provided by the patient. No language interpreter was used.  Ann Haynes is a 39 year old female with a history of hypertension presents for left sided sinus and left ear pain since yesterday. She states she gets sinus infections frequently but has not had one in the past 6 months. No treatment prior to arrival. She is currently living in a shelter with her children.  She denies any fever, chills, chest pain, shortness of breath, cough.  Past Medical History  Diagnosis Date  . Hypertension    History reviewed. No pertinent past surgical history. No family history on file. Social History  Substance Use Topics  . Smoking status: Never Smoker   . Smokeless tobacco: None  . Alcohol Use: Yes     Comment: occasionally   OB History    No data available     Review of Systems  Constitutional: Negative for fever and chills.  HENT: Positive for ear pain.   Respiratory: Negative for cough and shortness of breath.       Allergies  Review of patient's allergies indicates no known allergies.  Home Medications   Prior to Admission medications   Medication Sig Start Date End Date Taking? Authorizing Provider  amLODipine (NORVASC) 5 MG tablet Take 5 mg by mouth daily.    Historical Provider, MD  amoxicillin (AMOXIL) 500 MG capsule Take 1 capsule (500 mg total) by mouth 3 (three) times daily. 07/22/15   Kalanie Fewell Patel-Mills, PA-C  amoxicillin-clavulanate (AUGMENTIN) 875-125 MG per tablet Take 1 tablet by mouth 2 (two) times daily. One po bid x 7 days 09/17/14   Elwin Mocha, MD  fexofenadine-pseudoephedrine (ALLEGRA-D) 60-120 MG per tablet Take 1 tablet by mouth every 12 (twelve) hours. 12/21/12   Tatyana  Kirichenko, PA-C  fluticasone (FLONASE) 50 MCG/ACT nasal spray Place 2 sprays into the nose daily. 12/21/12   Tatyana Kirichenko, PA-C  hydrochlorothiazide (HYDRODIURIL) 25 MG tablet Take 25 mg by mouth daily.    Historical Provider, MD  HYDROcodone-acetaminophen (NORCO/VICODIN) 5-325 MG per tablet Take 1 tablet by mouth every 6 (six) hours as needed for moderate pain. 09/17/14   Elwin Mocha, MD  ibuprofen (ADVIL,MOTRIN) 200 MG tablet Take 600 mg by mouth every 6 (six) hours as needed for pain.    Historical Provider, MD   BP 98/77 mmHg  Pulse 50  Temp(Src) 97.6 F (36.4 C) (Oral)  Resp 17  Ht  (1.676 m)  Wt 277 lb (125.646 kg)  BMI 44.73 kg/m2  SpO2 100%  LMP 06/27/2015 Physical Exam  Constitutional: She is oriented to person, place, and time. She appears well-developed and well-nourished.  HENT:  Head: Normocephalic and atraumatic.  Right Ear: Tympanic membrane normal.  Left Ear: Tympanic membrane normal.  Normal TMs and ear canals bilaterally. Left-sided frontal and maxillary early sinus tenderness. No anterior cervical lymphadenopathy. Oropharynx is normal.  Eyes: Conjunctivae are normal.  Neck: Neck supple.  Cardiovascular: Normal rate, regular rhythm and normal heart sounds.   Pulmonary/Chest: Effort normal and breath sounds normal. No respiratory distress. She has no wheezes. She has no rales.  Lungs are clear to auscultation bilaterally. No decreased breath sounds or wheezing.  Musculoskeletal: Normal range of motion.  Neurological: She is alert and oriented to  person, place, and time.  Skin: Skin is warm and dry.  Nursing note and vitals reviewed.   ED Course  Procedures (including critical care time) Labs Review Labs Reviewed - No data to display  Imaging Review No results found.   EKG Interpretation None      MDM   Final diagnoses:  Acute sphenoidal sinusitis, recurrence not specified  Patient presents for left-sided reproducible facial pain. I  believe this is a sinus infection. She states she's had these several times in the past but nothing recently. I do not believe this is allergies. She was prescribed amoxicillin. I discussed follow-up as well as return precautions with the patient and she verbally agrees with the plan.  Medications  amoxicillin (AMOXIL) capsule 500 mg (500 mg Oral Given 07/22/15 1105)       Catha GosselinHanna Patel-Mills, PA-C 07/22/15 1525  Arby BarretteMarcy Pfeiffer, MD 08/02/15 1500

## 2022-10-15 LAB — GLUCOSE, POCT (MANUAL RESULT ENTRY): POC Glucose: 107 mg/dl — AB (ref 70–99)

## 2022-10-15 NOTE — Progress Notes (Signed)
Pt has had food and drink this morning. Pt has high blood pressure but has not taken medication.
